# Patient Record
Sex: Male | Born: 1960 | Race: Black or African American | Hispanic: No | Marital: Single
Health system: Southern US, Community
[De-identification: ages and names within clinical notes are randomized; demographics above are authoritative.]

---

## 2009-03-11 ENCOUNTER — Inpatient Hospital Stay: Payer: Self-pay | Admitting: Internal Medicine

## 2018-08-23 ENCOUNTER — Emergency Department: Payer: PRIVATE HEALTH INSURANCE

## 2018-08-23 ENCOUNTER — Emergency Department
Admission: EM | Admit: 2018-08-23 | Discharge: 2018-08-23 | Disposition: A | Discharge status: Home / Self Care | Payer: PRIVATE HEALTH INSURANCE | Attending: Emergency Medicine | Admitting: Emergency Medicine

## 2018-08-23 ENCOUNTER — Encounter: Payer: Self-pay | Admitting: Emergency Medicine

## 2018-08-23 ENCOUNTER — Other Ambulatory Visit: Payer: Self-pay

## 2018-08-23 DIAGNOSIS — Y990 Civilian activity done for income or pay: Secondary | ICD-10-CM | POA: Insufficient documentation

## 2018-08-23 DIAGNOSIS — Y939 Activity, unspecified: Secondary | ICD-10-CM | POA: Insufficient documentation

## 2018-08-23 DIAGNOSIS — M79645 Pain in left finger(s): Secondary | ICD-10-CM

## 2018-08-23 DIAGNOSIS — W230XXA Caught, crushed, jammed, or pinched between moving objects, initial encounter: Secondary | ICD-10-CM | POA: Diagnosis not present

## 2018-08-23 DIAGNOSIS — S60012A Contusion of left thumb without damage to nail, initial encounter: Secondary | ICD-10-CM | POA: Insufficient documentation

## 2018-08-23 DIAGNOSIS — Y929 Unspecified place or not applicable: Secondary | ICD-10-CM | POA: Insufficient documentation

## 2018-08-23 DIAGNOSIS — S6992XA Unspecified injury of left wrist, hand and finger(s), initial encounter: Secondary | ICD-10-CM | POA: Diagnosis present

## 2018-08-23 NOTE — ED Notes (Signed)
See triage note  States he jammed his left thumb between elevator and machine  Swelling and tenderness noted

## 2018-08-23 NOTE — ED Notes (Signed)
[facility] --no urine drug screen.

## 2018-08-23 NOTE — Discharge Instructions (Signed)
Your x-ray was unremarkable for fracture.  Follow discharge care instructions.  Advised over-the-counter ibuprofen as needed for pain.

## 2018-08-23 NOTE — ED Provider Notes (Signed)
[facility] Emergency Department Provider Note   ____________________________________________   First MD Initiated Contact with Patient 08/23/18 1041     (approximate)  I have reviewed the triage vital signs and the nursing notes.   HISTORY  Chief Complaint Hand Pain    HPI James JoeWilliam Wiberg Jr. is a 57 y.o. male presents with left pain and edema secondary to contusion.  Patient states her left thumb was smashed between equipment at work today.  Patient denies loss of sensation but decreased range of motion with flexion.  Patient is left-hand dominant.  Patient rates pain as a 5/10.  Patient described the pain is "achy".  No palliative measure for complaint.  It was noted patient blood pressure elevated but he states noncompliant with his medication.   History reviewed. No pertinent past medical history.  There are no active problems to display for this patient.   History reviewed. No pertinent surgical history.  Prior to Admission medications   Not on File    Allergies Patient has no known allergies.  No family history on file.  Social History Social History   Tobacco Use  . Smoking status: Not on file  Substance Use Topics  . Alcohol use: Not on file  . Drug use: Not on file    Review of Systems Constitutional: No fever/chills Eyes: No visual changes. ENT: No sore throat. Cardiovascular: Denies chest pain. Respiratory: Denies shortness of breath. Gastrointestinal: No abdominal pain.  No nausea, no vomiting.  No diarrhea.  No constipation. Genitourinary: Negative for dysuria. Musculoskeletal: Left thumb pain. Skin: Negative for rash. Neurological: Negative for headaches, focal weakness or numbness.   ____________________________________________   PHYSICAL EXAM:  VITAL SIGNS: ED Triage Vitals  Enc Vitals Group     BP 08/23/18 1022 (!) 190/129     Pulse Rate 08/23/18 1022 92     Resp --      Temp 08/23/18 1022 98.2 F  (36.8 C)     Temp Source 08/23/18 1022 Oral     SpO2 08/23/18 1022 98 %     Weight 08/23/18 1021 205 lb (93 kg)     Height 08/23/18 1021 5\' 5"  (1.651 m)     Head Circumference --      Peak Flow --      Pain Score 08/23/18 1027 5     Pain Loc --      Pain Edu? --      Excl. in GC? --     Constitutional: Alert and oriented. Well appearing and in no acute distress. Cardiovascular: Normal rate, regular rhythm. Grossly normal heart sounds.  Good peripheral circulation. Respiratory: Normal respiratory effort.  No retractions. Lungs CTAB. Gastrointestinal: Soft and nontender. No distention. No abdominal bruits. No CVA tenderness. Musculoskeletal: No obvious deformity to the left thumb. Neurologic:  Normal speech and language. No gross focal neurologic deficits are appreciated. No gait instability. Skin:  Skin is warm, dry and intact. No rash noted.  No abrasion or ecchymosis. Psychiatric: Mood and affect are normal. Speech and behavior are normal.  ____________________________________________   LABS (all labs ordered are listed, but only abnormal results are displayed)  Labs Reviewed - No data to display ____________________________________________  EKG   ____________________________________________  RADIOLOGY  ED MD interpretation:    Official radiology report(s): Dg Finger Thumb Left  Result Date: 08/23/2018 CLINICAL DATA:  Jammed thumb between wall and machine at work EXAM: LEFT THUMB 2+V COMPARISON:  None. FINDINGS: Frontal, oblique, and lateral views were  obtained. There is no fracture or dislocation. Joint spaces appear normal. No erosive change. IMPRESSION: No fracture or dislocation.  No evident arthropathy. Electronically Signed   By: Bretta Bang III M.D.   On: 08/23/2018 11:06    ____________________________________________   PROCEDURES  Procedure(s) performed: None  Procedures  Critical Care performed:  No  ____________________________________________   INITIAL IMPRESSION / ASSESSMENT AND PLAN / ED COURSE  As part of my medical decision making, I reviewed the following data within the electronic MEDICAL RECORD NUMBER    Left thumb pain secondary to contusion.  Discussed negative x-ray findings with patient.  Patient given discharge care instruction advised over-the-counter anti-inflammatory medication as needed.      ____________________________________________   FINAL CLINICAL IMPRESSION(S) / ED DIAGNOSES  Final diagnoses:  Finger pain, left  Contusion of left thumb without damage to nail, initial encounter     ED Discharge Orders    None       Note:  This document was prepared using Dragon voice recognition software and may include unintentional dictation errors.    Joni Reining, PA-C 08/23/18 1120    Jene Every, MD 08/23/18 [phone]

## 2018-08-23 NOTE — ED Triage Notes (Signed)
L thumb pain since smashed between equipment about 8am today. Works for [employer].

## 2019-10-29 IMAGING — DX DG FINGER THUMB 2+V*L*
3 series · 3 of 3 positions shown · non-contrast
Comparison: None.

CLINICAL DATA: Jammed thumb between wall and machine at work

EXAM:
LEFT THUMB 2+V

[finger ap]
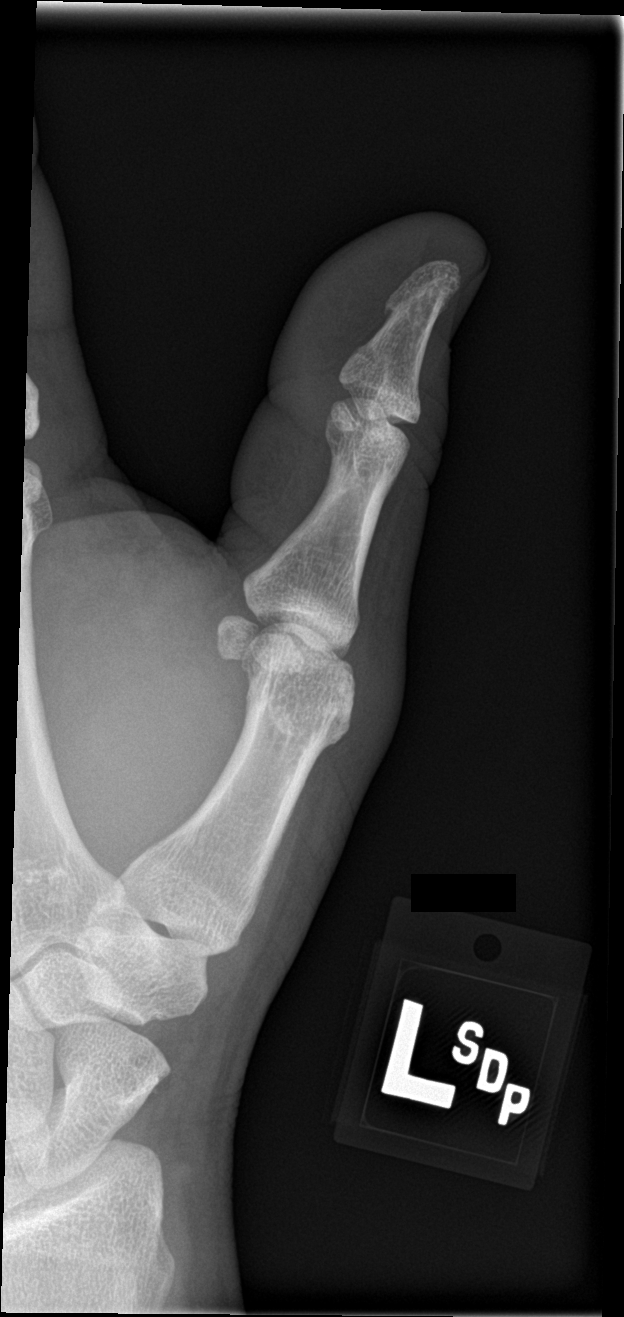

[finger obl]
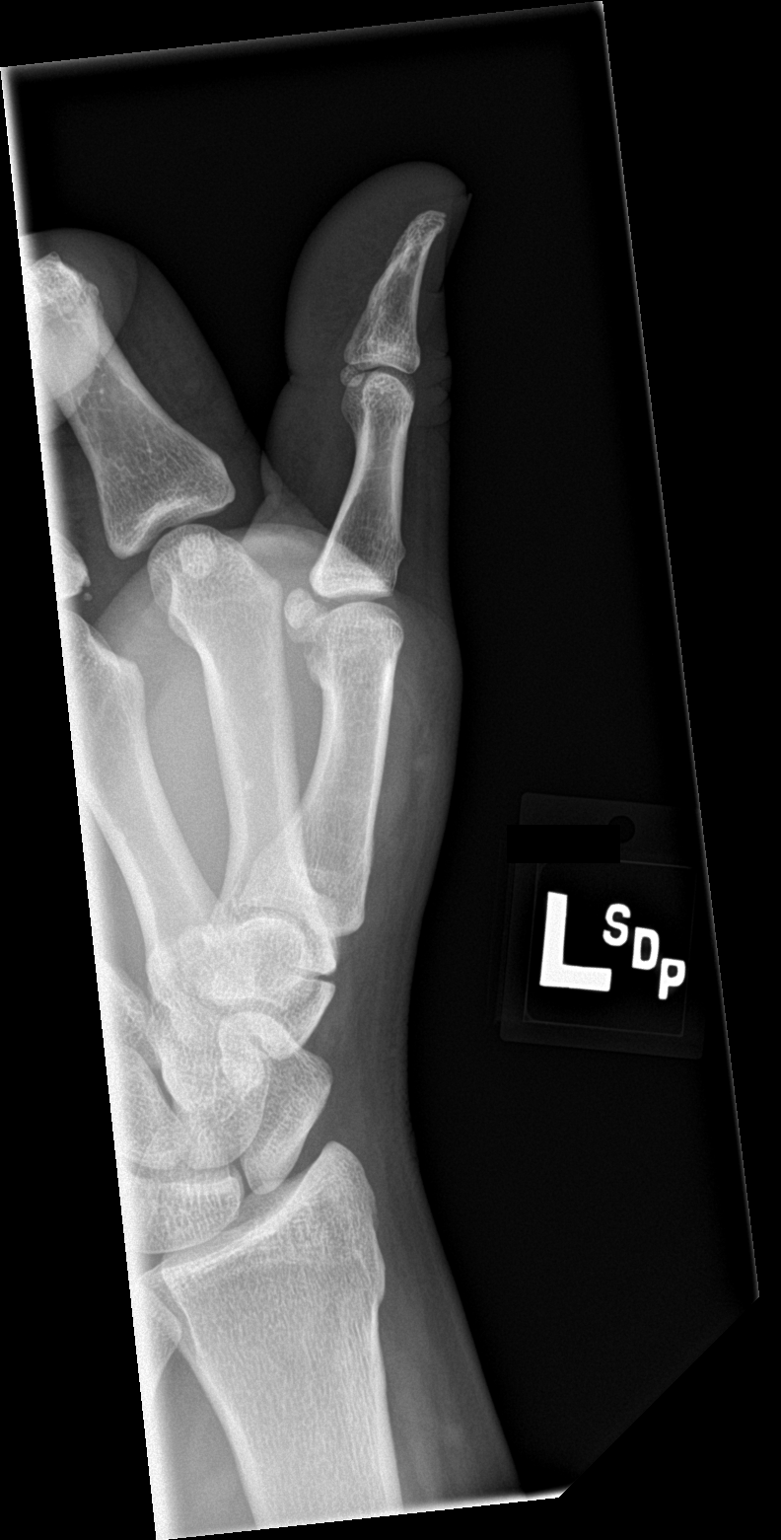

[finger lat]
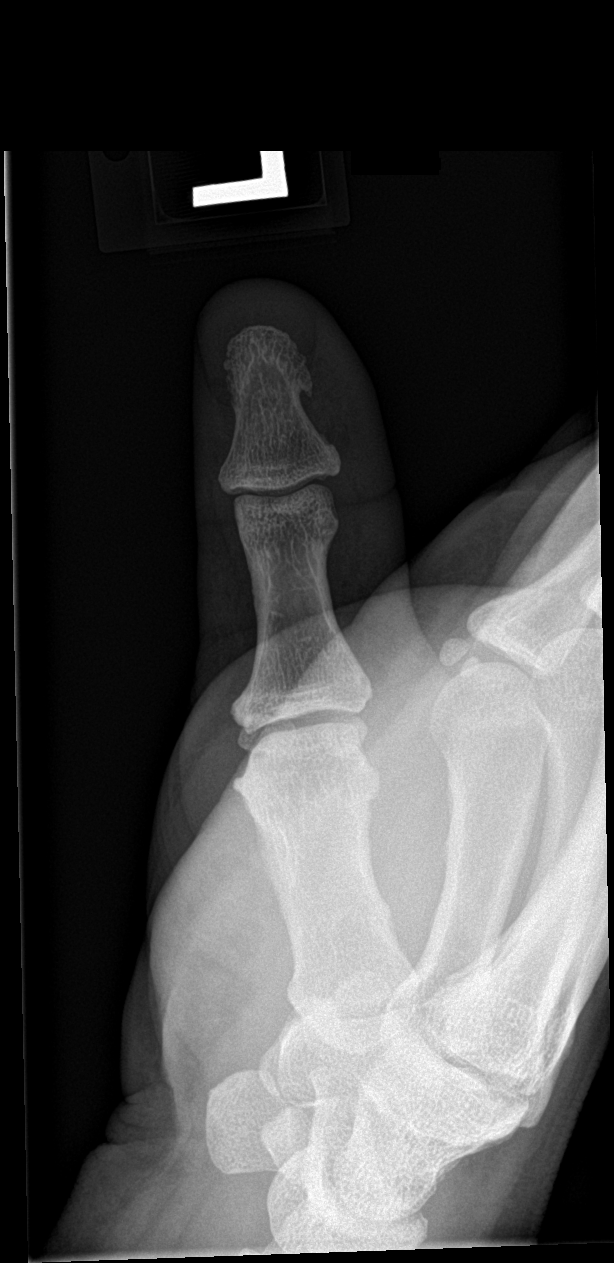

[3 of 3 positions shown; findings below may reference images not displayed]

FINDINGS: Frontal, oblique, and lateral views were obtained. There is no
fracture or dislocation. Joint spaces appear normal. No erosive
change.
IMPRESSION: No fracture or dislocation.  No evident arthropathy.
